# Patient Record
Sex: Male | Born: 1965 | Race: White | Hispanic: Yes | Marital: Married | Smoking: Former smoker
Health system: Southern US, Community
[De-identification: ages and names within clinical notes are randomized; demographics above are authoritative.]

## PROBLEM LIST (undated history)

## (undated) DIAGNOSIS — E119 Type 2 diabetes mellitus without complications: Secondary | ICD-10-CM

---

## 2006-11-04 ENCOUNTER — Emergency Department (HOSPITAL_COMMUNITY): Admission: EM | Admit: 2006-11-04 | Discharge: 2006-11-05 | Payer: Self-pay | Admitting: Emergency Medicine

## 2007-10-14 ENCOUNTER — Emergency Department (HOSPITAL_COMMUNITY): Admission: EM | Admit: 2007-10-14 | Discharge: 2007-10-14 | Payer: Self-pay | Admitting: Emergency Medicine

## 2009-05-07 ENCOUNTER — Emergency Department (HOSPITAL_COMMUNITY): Admission: EM | Admit: 2009-05-07 | Discharge: 2009-05-07 | Payer: Self-pay | Admitting: Emergency Medicine

## 2010-07-27 ENCOUNTER — Encounter
Admission: RE | Admit: 2010-07-27 | Discharge: 2010-07-27 | Payer: Self-pay | Source: Home / Self Care | Attending: Physical Medicine and Rehabilitation | Admitting: Physical Medicine and Rehabilitation

## 2010-11-20 LAB — COMPREHENSIVE METABOLIC PANEL
ALT: 26 U/L (ref 0–53)
AST: 22 U/L (ref 0–37)
CO2: 26 mEq/L (ref 19–32)
Chloride: 101 mEq/L (ref 96–112)
GFR calc Af Amer: >60 mL/min (ref >60)
GFR calc non Af Amer: >60 mL/min (ref >60)
Glucose, Bld: 149 mg/dL — ABNORMAL HIGH (ref 70–99)
Sodium: 136 mEq/L (ref 135–145)
Total Bilirubin: 0.7 mg/dL (ref 0.3–1.2)

## 2010-11-20 LAB — URINALYSIS, ROUTINE W REFLEX MICROSCOPIC
Glucose, UA: NEGATIVE mg/dL
Ketones, ur: NEGATIVE mg/dL
Leukocytes, UA: NEGATIVE
Protein, ur: NEGATIVE mg/dL
pH: 7.5 (ref 5.0–8.0)

## 2010-11-20 LAB — URINE MICROSCOPIC-ADD ON

## 2010-11-20 LAB — LIPASE, BLOOD: Lipase: 32 U/L (ref 11–59)

## 2010-11-20 LAB — CBC
Hemoglobin: 15.2 g/dL (ref 13.0–17.0)
MCV: 86.2 fL (ref 78.0–100.0)
RBC: 4.96 MIL/uL (ref 4.22–5.81)
WBC: 14.3 10*3/uL — ABNORMAL HIGH (ref 4.0–10.5)

## 2010-11-20 LAB — DIFFERENTIAL
Basophils Absolute: 0 10*3/uL (ref 0.0–0.1)
Basophils Relative: 0 % (ref 0–1)
Eosinophils Absolute: 0 10*3/uL (ref 0.0–0.7)
Eosinophils Relative: 0 % (ref 0–5)
Neutrophils Relative %: 83 % — ABNORMAL HIGH (ref 43–77)

## 2010-11-20 LAB — URINE CULTURE

## 2018-11-05 ENCOUNTER — Other Ambulatory Visit: Payer: Self-pay

## 2018-11-05 ENCOUNTER — Emergency Department (HOSPITAL_COMMUNITY): Payer: BLUE CROSS/BLUE SHIELD

## 2018-11-05 ENCOUNTER — Encounter (HOSPITAL_COMMUNITY): Payer: Self-pay

## 2018-11-05 ENCOUNTER — Emergency Department (HOSPITAL_COMMUNITY)
Admission: EM | Admit: 2018-11-05 | Discharge: 2018-11-05 | Disposition: A | Discharge status: Home / Self Care | Payer: BLUE CROSS/BLUE SHIELD | Attending: Emergency Medicine | Admitting: Emergency Medicine

## 2018-11-05 DIAGNOSIS — R1012 Left upper quadrant pain: Secondary | ICD-10-CM | POA: Diagnosis not present

## 2018-11-05 DIAGNOSIS — Y939 Activity, unspecified: Secondary | ICD-10-CM | POA: Insufficient documentation

## 2018-11-05 DIAGNOSIS — S299XXA Unspecified injury of thorax, initial encounter: Secondary | ICD-10-CM | POA: Diagnosis present

## 2018-11-05 DIAGNOSIS — W108XXA Fall (on) (from) other stairs and steps, initial encounter: Secondary | ICD-10-CM | POA: Insufficient documentation

## 2018-11-05 DIAGNOSIS — S2242XA Multiple fractures of ribs, left side, initial encounter for closed fracture: Secondary | ICD-10-CM

## 2018-11-05 DIAGNOSIS — Y999 Unspecified external cause status: Secondary | ICD-10-CM | POA: Diagnosis not present

## 2018-11-05 DIAGNOSIS — Y929 Unspecified place or not applicable: Secondary | ICD-10-CM | POA: Insufficient documentation

## 2018-11-05 DIAGNOSIS — W2209XA Striking against other stationary object, initial encounter: Secondary | ICD-10-CM | POA: Diagnosis not present

## 2018-11-05 HISTORY — DX: Type 2 diabetes mellitus without complications: E11.9

## 2018-11-05 LAB — CBC WITH DIFFERENTIAL/PLATELET
Abs Immature Granulocytes: 0.03 10*3/uL (ref 0.00–0.07)
Basophils Absolute: 0.1 10*3/uL (ref 0.0–0.1)
Basophils Relative: 1 %
Eosinophils Absolute: 0.2 10*3/uL (ref 0.0–0.5)
Eosinophils Relative: 3 %
HCT: 47.2 % (ref 39.0–52.0)
Hemoglobin: 16.5 g/dL (ref 13.0–17.0)
Immature Granulocytes: 0 %
Lymphocytes Relative: 43 %
Lymphs Abs: 3.3 10*3/uL (ref 0.7–4.0)
MCH: 30.2 pg (ref 26.0–34.0)
MCHC: 35 g/dL (ref 30.0–36.0)
MCV: 86.3 fL (ref 80.0–100.0)
Monocytes Absolute: 0.6 10*3/uL (ref 0.1–1.0)
Monocytes Relative: 7 %
Neutro Abs: 3.5 10*3/uL (ref 1.7–7.7)
Neutrophils Relative %: 46 %
Platelets: 232 10*3/uL (ref 150–400)
RBC: 5.47 MIL/uL (ref 4.22–5.81)
RDW: 12.8 % (ref 11.5–15.5)
WBC: 7.6 10*3/uL (ref 4.0–10.5)
nRBC: 0 % (ref 0.0–0.2)

## 2018-11-05 LAB — BASIC METABOLIC PANEL
Anion gap: 10 (ref 5–15)
BUN: 29 mg/dL — ABNORMAL HIGH (ref 6–20)
CO2: 22 mmol/L (ref 22–32)
Calcium: 9.5 mg/dL (ref 8.9–10.3)
Chloride: 106 mmol/L (ref 98–111)
Creatinine, Ser: 1.33 mg/dL — ABNORMAL HIGH (ref 0.61–1.24)
GFR calc Af Amer: >60 mL/min (ref >60)
GFR calc non Af Amer: >60 mL/min (ref >60)
Glucose, Bld: 190 mg/dL — ABNORMAL HIGH (ref 70–99)
Potassium: 4.2 mmol/L (ref 3.5–5.1)
Sodium: 138 mmol/L (ref 135–145)

## 2018-11-05 MED ORDER — IOHEXOL 300 MG/ML  SOLN
100.0000 mL | Freq: Once | INTRAMUSCULAR | Status: AC | PRN
  Administered 2018-11-05: 100 mL via INTRAVENOUS

## 2018-11-05 MED ORDER — KETOROLAC TROMETHAMINE 30 MG/ML IJ SOLN
15.0000 mg | Freq: Once | INTRAMUSCULAR | Status: AC
  Administered 2018-11-05: 15 mg via INTRAVENOUS
  Filled 2018-11-05: qty 1

## 2018-11-05 MED ORDER — DIAZEPAM 5 MG PO TABS: 2.5000 mg | ORAL_TABLET | Freq: Three times a day (TID) | ORAL | 0 refills | Status: DC | PRN

## 2018-11-05 MED ORDER — HYDROMORPHONE HCL 1 MG/ML IJ SOLN
1.0000 mg | Freq: Once | INTRAMUSCULAR | Status: AC
  Administered 2018-11-05: 1 mg via INTRAVENOUS
  Filled 2018-11-05: qty 1

## 2018-11-05 MED ORDER — IBUPROFEN 600 MG PO TABS: 600.0000 mg | ORAL_TABLET | Freq: Four times a day (QID) | ORAL | 0 refills | Status: DC | PRN

## 2018-11-05 MED ORDER — LORAZEPAM 2 MG/ML IJ SOLN
1.0000 mg | Freq: Once | INTRAMUSCULAR | Status: AC
  Administered 2018-11-05: 1 mg via INTRAVENOUS
  Filled 2018-11-05: qty 1

## 2018-11-05 MED ORDER — OXYCODONE-ACETAMINOPHEN 5-325 MG PO TABS: 1.0000 | ORAL_TABLET | Freq: Four times a day (QID) | ORAL | 0 refills | Status: DC | PRN

## 2018-11-05 MED ORDER — HYDROMORPHONE HCL 1 MG/ML IJ SOLN
1.0000 mg | Freq: Once | INTRAMUSCULAR | Status: AC
Start: 2018-11-05 — End: 2018-11-05
  Administered 2018-11-05: 1 mg via INTRAVENOUS
  Filled 2018-11-05: qty 1

## 2018-11-05 MED ORDER — OXYCODONE-ACETAMINOPHEN 5-325 MG PO TABS: 2.0000 | ORAL_TABLET | ORAL | 0 refills | Status: DC | PRN

## 2018-11-05 MED ORDER — SODIUM CHLORIDE 0.9 % IV BOLUS
1000.0000 mL | Freq: Once | INTRAVENOUS | Status: AC
  Administered 2018-11-05: 1000 mL via INTRAVENOUS

## 2018-11-05 NOTE — Discharge Instructions (Signed)
Contact information for a hand surgeon with regards to your finger is included.

## 2018-11-05 NOTE — ED Triage Notes (Signed)
Pt brought in by [facility] following a fall from stool and hit his left rib cage area on chair, pt denies hitting head or LOC.

## 2018-11-05 NOTE — ED Provider Notes (Signed)
[facility] EMERGENCY DEPARTMENT Provider Note   CSN: [id number] Arrival date & time: 11/05/18  1715    History   Chief Complaint Chief Complaint  Patient presents with   Fall    HPI Emrey Maniscalco is a 53 y.o. male.     HPI   53 year old male presenting after fall.  He was standing on a stool trying to reach something.  He lost his balance and struck the left side of his chest against the rail of a smaller chair.  He has had severe and constant pain since then.  Pain in his left lateral chest and to his abdomen.  It is worse with movement and breathing.  He did not strike his head.  Denies any significant headache or neck pain.  He is not anticoagulated.  Patient is primarily Spanish-speaking.  Interpreter service was utilized.    There are no active problems to display for this patient.  History reviewed. No pertinent surgical history.    Home Medications    Prior to Admission medications   Not on File    Family History No family history on file.  Social History Social History   Tobacco Use   Smoking status: Not on file  Substance Use Topics   Alcohol use: Not on file   Drug use: Not on file     Allergies   Patient has no allergy information on record.   Review of Systems Review of Systems  All systems reviewed and negative, other than as noted in HPI.  Physical Exam Updated Vital Signs Ht 5\' 6"  (1.676 m)    Wt 68 kg    BMI 24.21 kg/m   Physical Exam Vitals signs and nursing note reviewed.  Constitutional:      General: He is in acute distress.     Appearance: He is well-developed.  HENT:     Head: Normocephalic and atraumatic.  Eyes:     General:        Right eye: No discharge.        Left eye: No discharge.     Conjunctiva/sclera: Conjunctivae normal.  Neck:     Musculoskeletal: Neck supple.  Cardiovascular:     Rate and Rhythm: Normal rate and regular rhythm.     Heart sounds: Normal heart sounds. No murmur. No friction rub. No  gallop.   Pulmonary:     Effort: Pulmonary effort is normal. No respiratory distress.     Breath sounds: Normal breath sounds.  Abdominal:     General: There is no distension.     Palpations: Abdomen is soft.     Tenderness: There is no abdominal tenderness.  Musculoskeletal:     Comments: Tenderness to palpation along the left lateral chest wall.  No overlying skin changes.  No crepitus.  Breath sounds sound clear and symmetric bilaterally although he is splinting generally not moving great air.  He is tender in his left flank and into the mid abdomen.  Bedside FAST exam without evidence of free fluid.  No midline spinal tenderness.  Skin:    General: Skin is warm and dry.  Neurological:     Mental Status: He is alert.  Psychiatric:        Behavior: Behavior normal.        Thought Content: Thought content normal.      ED Treatments / Results  Labs (all labs ordered are listed, but only abnormal results are displayed) Labs Reviewed  BASIC METABOLIC PANEL - Abnormal; Notable  for the following components:      Result Value   Glucose, Bld 190 (*)    BUN 29 (*)    Creatinine, Ser 1.33 (*)    All other components within normal limits  CBC WITH DIFFERENTIAL/PLATELET    EKG None  Radiology Dg Ribs Unilateral W/chest Left  Result Date: 11/05/2018 CLINICAL DATA:  Recent fall with left-sided chest pain, initial encounter EXAM: LEFT RIBS AND CHEST - 3+ VIEW COMPARISON:  11/04/2006 FINDINGS: Cardiac shadow is within normal limits. The lungs are hypoinflated with elevation of the right hemidiaphragm. Mild right basilar atelectasis is seen. No pneumothorax or effusion is noted. Mildly displaced fractures of the left eighth through eleventh ribs are seen without evidence of pneumothorax. No other focal abnormality is seen. IMPRESSION: Multiple left rib fractures without pneumothorax. Electronically Signed   By: Alcide CleverMark  Lukens M.D.   On: 11/05/2018 19:12   Ct Abdomen Pelvis W  Contrast  Result Date: 11/05/2018 CLINICAL DATA:  Recent fall with left-sided chest pain, initial encounter EXAM: CT ABDOMEN AND PELVIS WITH CONTRAST TECHNIQUE: Multidetector CT imaging of the abdomen and pelvis was performed using the standard protocol following bolus administration of intravenous contrast. CONTRAST:  100mL OMNIPAQUE IOHEXOL 300 MG/ML  SOLN COMPARISON:  Plain films from earlier in the same day. FINDINGS: Lower chest: No acute abnormality. Hepatobiliary: No focal liver abnormality is seen. No gallstones, gallbladder wall thickening, or biliary dilatation. Pancreas: Unremarkable. No pancreatic ductal dilatation or surrounding inflammatory changes. Spleen: Normal in size without focal abnormality. Adrenals/Urinary Tract: Adrenal glands are within normal limits bilaterally. Kidneys demonstrate a normal enhancement pattern bilaterally. No visceral injury is seen. No extravasation is noted. The ureters are within normal limits. The bladder is well distended. Stomach/Bowel: No obstructive or inflammatory changes of the bowel are seen. The appendix is within normal limits. The stomach and small bowel are unremarkable. Vascular/Lymphatic: Aortic atherosclerosis. No enlarged abdominal or pelvic lymph nodes. Reproductive: Prostate is unremarkable. Other: No abdominal wall hernia or abnormality. No abdominopelvic ascites. Musculoskeletal: Rib fractures are noted involving the eighth through eleventh ribs on the left. Mild degenerative changes of the lumbar spine are seen. IMPRESSION: Multiple left rib fractures similar to that seen on prior plain film examination. No acute visceral abnormality is noted. Electronically Signed   By: Alcide CleverMark  Lukens M.D.   On: 11/05/2018 19:35    Procedures Procedures (including critical care time)  Definitve Fracture Care. Closed treatment of uncomplicated rib fracture: 4 (four):  Definitive fracture care was provided for four closed rib fractures. Treatment including  pain medication, discussion of possible complications including pneumonia, discussion of usage of incentive spirometry, and referral to primary care physician (our resource list provided) for further followup.  EMERGENCY DEPARTMENT US FAST EXAM "Limited Ultrasound of the Abdomen and Pericardium" (FAST Exam).   INDICATIONS:Blunt trauma to the thorax Multiple views of the abdomen and pericardium are obtained with a multi-frequency probe.  PERFORMED BY: Myself IMAGES ARCHIVED?: No LIMITATIONS:  Emergent procedure INTERPRETATION:  Abdominal free fluid present   Medications Ordered in ED Medications  sodium chloride 0.9 % bolus 1,000 mL (has no administration in time range)  LORazepam (ATIVAN) injection 1 mg (has no administration in time range)  HYDROmorphone (DILAUDID) injection 1 mg (has no administration in time range)     Initial Impression / Assessment and Plan / ED Course  I have reviewed the triage vital signs and the nursing notes.  Pertinent labs & imaging results that were available during my care of the  patient were reviewed by me and considered in my medical decision making (see chart for details).    52yM with multiple L rib fractures. No pneumothorax. No splenic laceration. Pain reasonably controlled. Continued pain meds. Incentive spirometry. Work note. Return precautions discussed.   Final Clinical Impressions(s) / ED Diagnoses   Final diagnoses:  Closed fracture of multiple ribs of left side, initial encounter    ED Discharge Orders    None       Raeford Razor, MD 11/06/18 [phone]

## 2018-11-06 MED FILL — Oxycodone w/ Acetaminophen Tab 5-325 MG: ORAL | Qty: 6 | Status: AC

## 2019-06-13 ENCOUNTER — Other Ambulatory Visit: Payer: Self-pay

## 2019-06-13 DIAGNOSIS — Z20822 Contact with and (suspected) exposure to covid-19: Secondary | ICD-10-CM

## 2019-06-14 LAB — NOVEL CORONAVIRUS, NAA: SARS-CoV-2, NAA: NOT DETECTED

## 2019-06-15 ENCOUNTER — Telehealth: Payer: Self-pay | Admitting: Internal Medicine

## 2019-06-15 NOTE — Telephone Encounter (Signed)
Patient's son is calling for the patient's test results. Paitent's son will translate to the patient. Son expressed understanding.

## 2019-06-26 ENCOUNTER — Other Ambulatory Visit: Payer: Self-pay

## 2019-06-26 DIAGNOSIS — Z20822 Contact with and (suspected) exposure to covid-19: Secondary | ICD-10-CM

## 2019-06-27 LAB — NOVEL CORONAVIRUS, NAA: SARS-CoV-2, NAA: NOT DETECTED

## 2019-12-23 IMAGING — DX LEFT RIBS AND CHEST - 3+ VIEW
4 series · 4 of 4 positions shown · non-contrast
Comparison: 11/04/2006

CLINICAL DATA: Recent fall with left-sided chest pain, initial
encounter

EXAM:
LEFT RIBS AND CHEST - 3+ VIEW

[rib pa obl (1 of 2)]
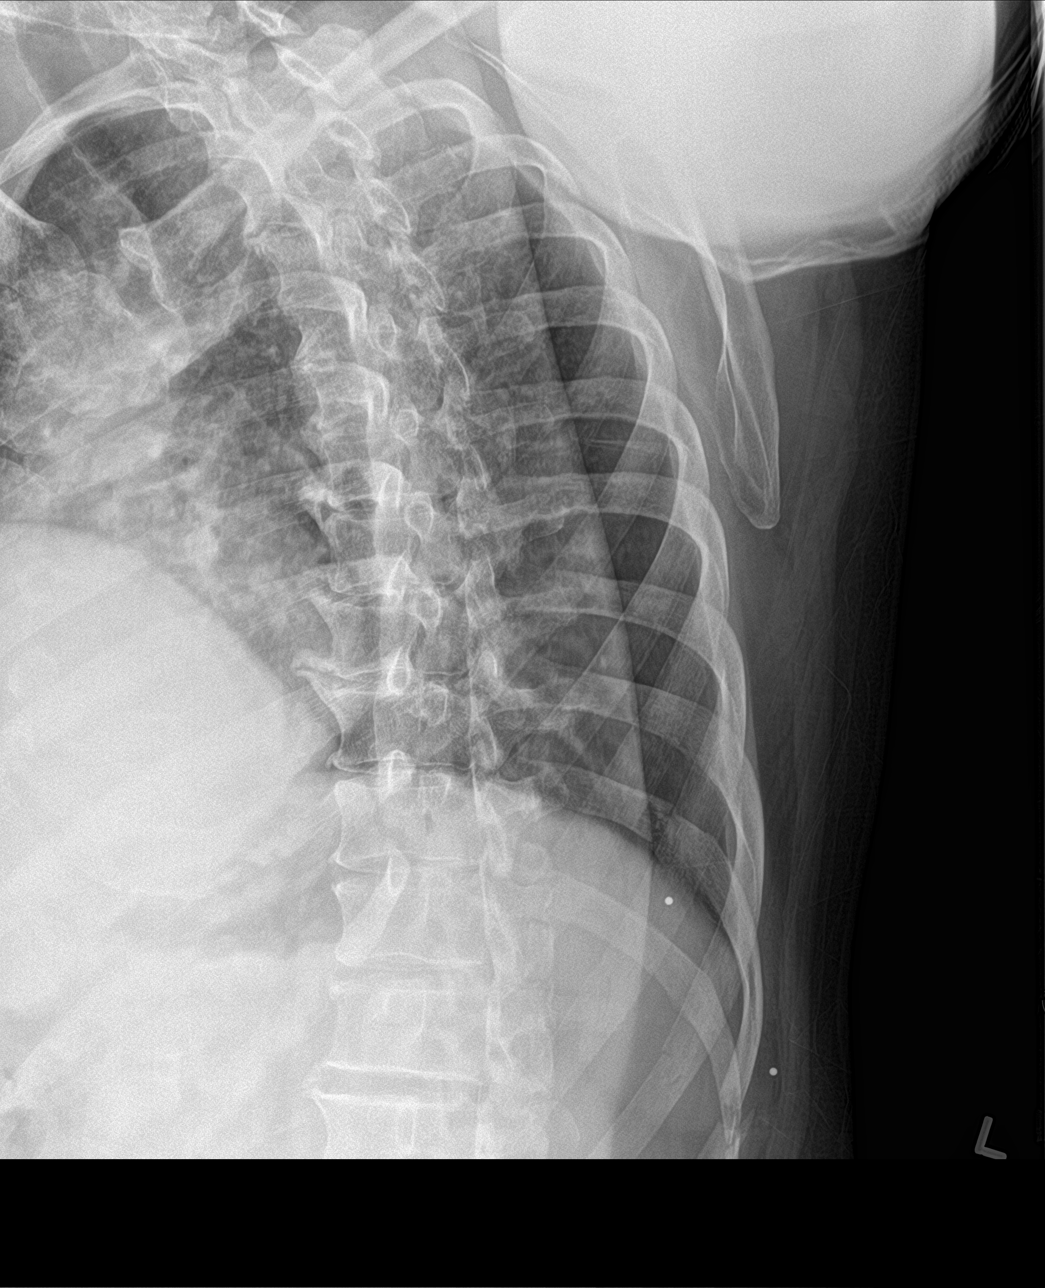

[rib pa obl (2 of 2)]
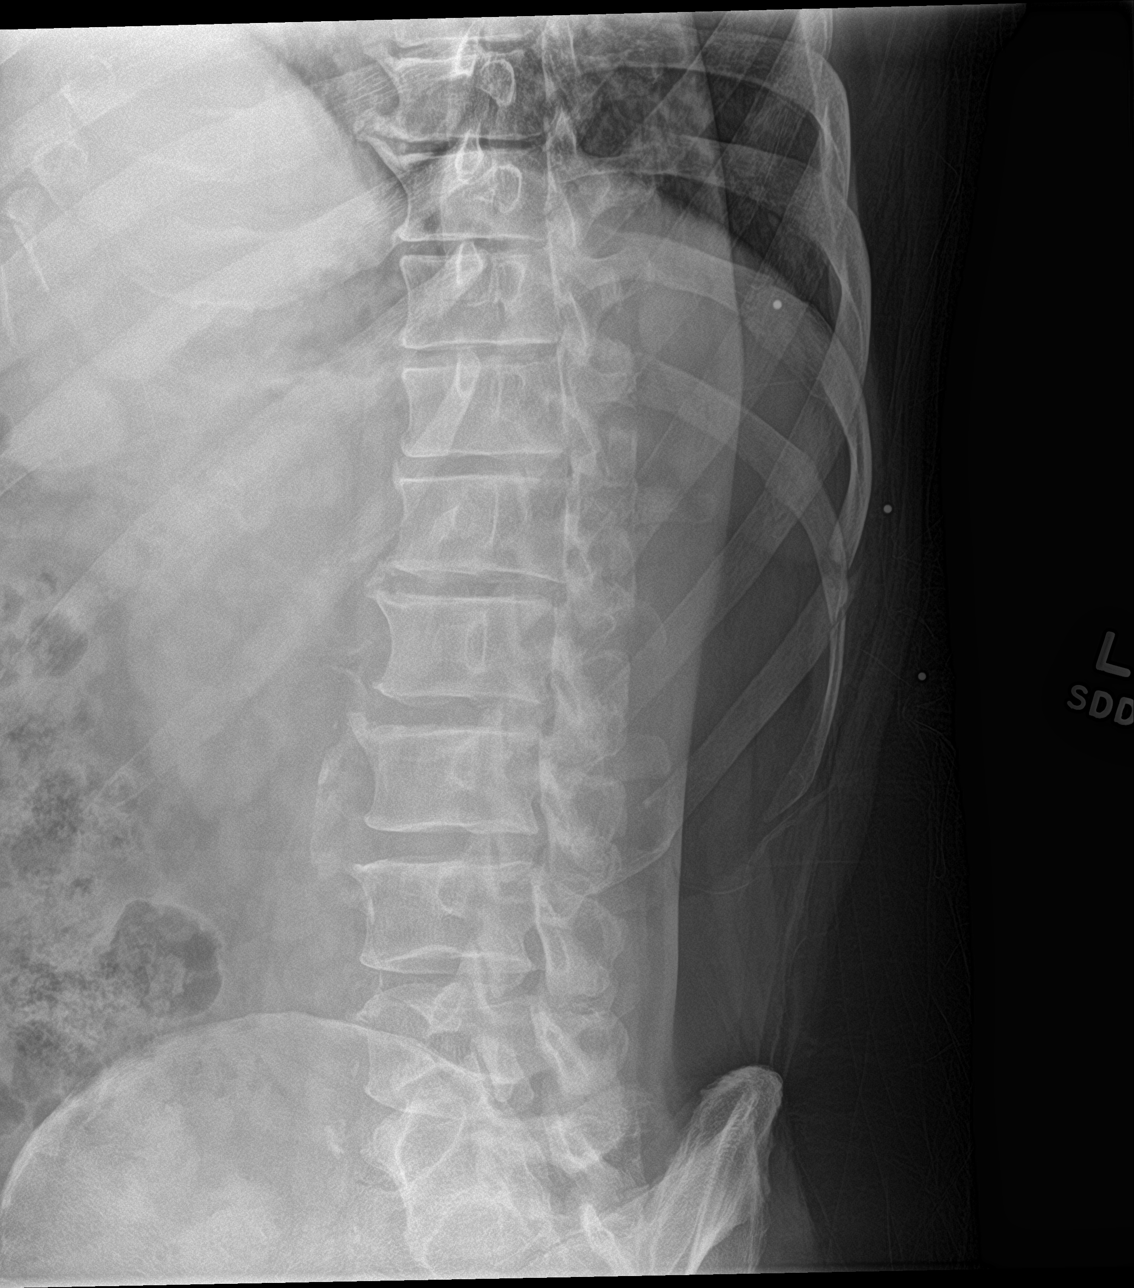

[rib pa]
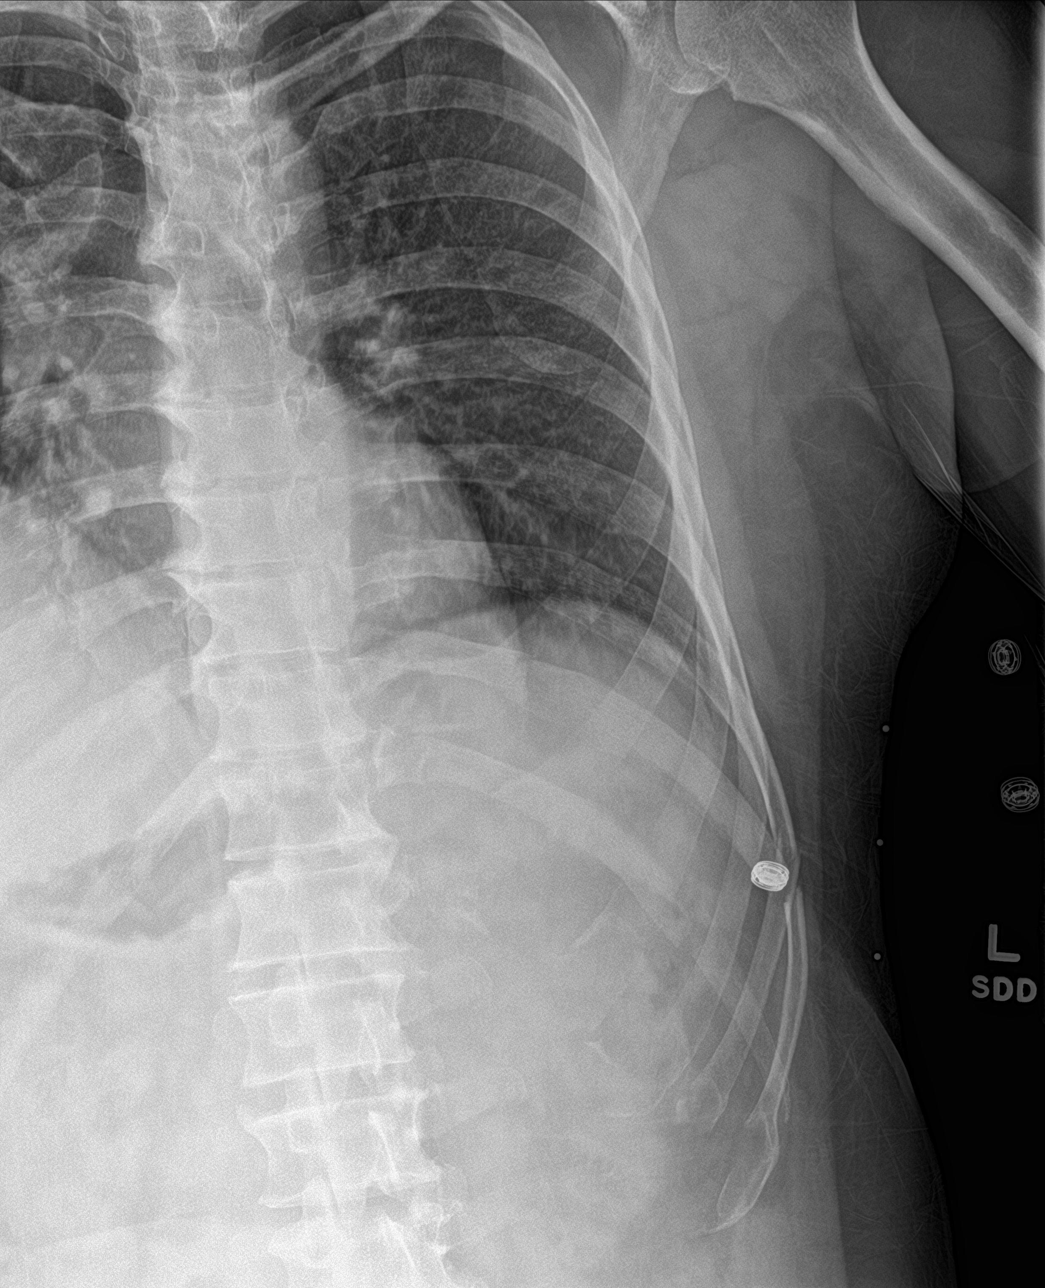

[chest ap]
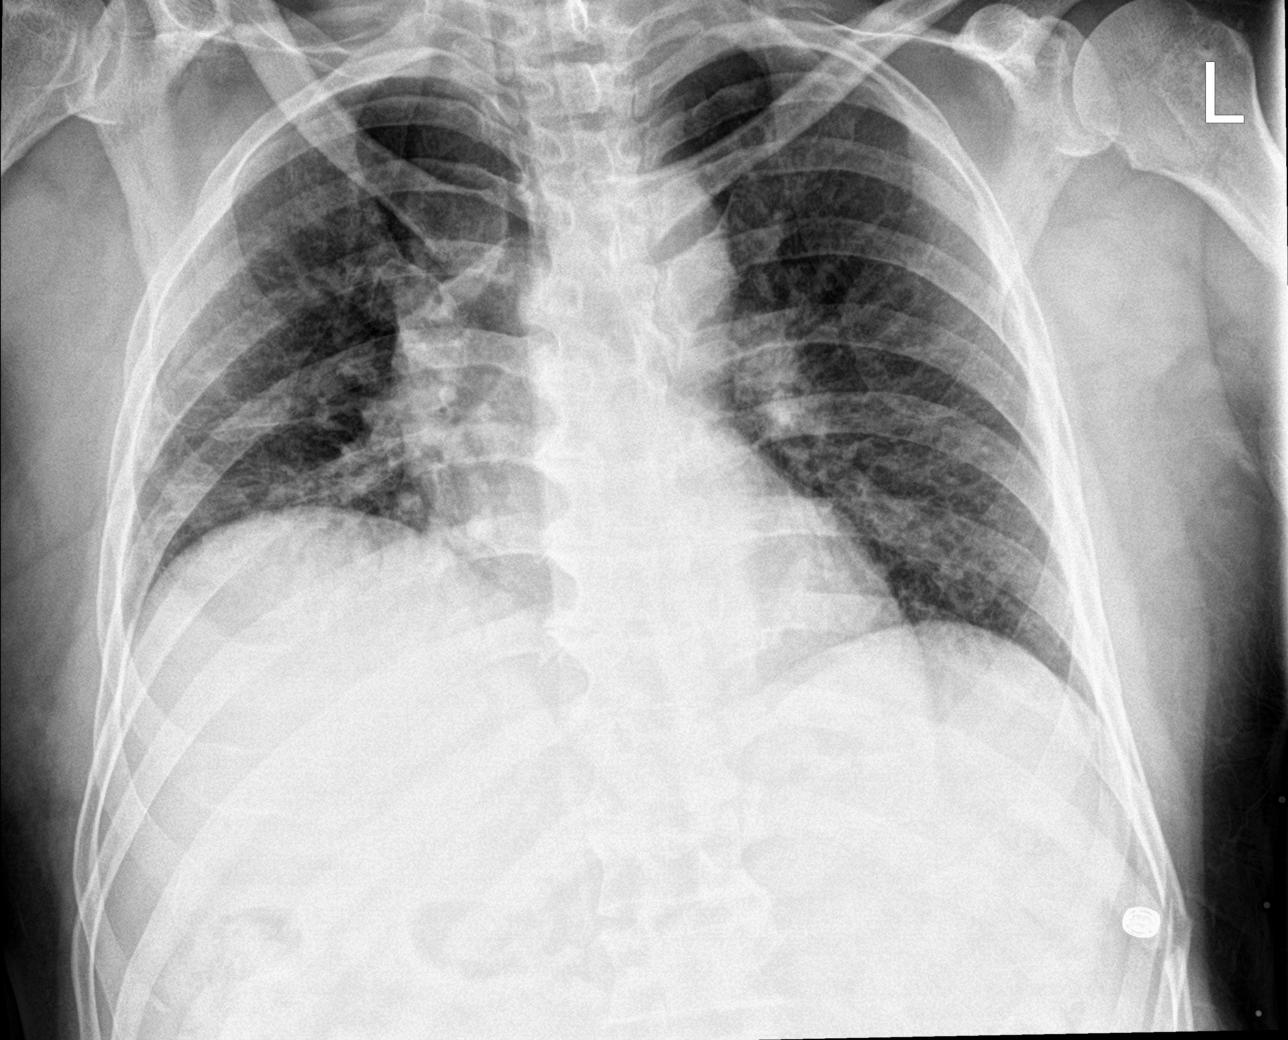

[4 of 4 positions shown; findings below may reference images not displayed]

FINDINGS: Cardiac shadow is within normal limits. The lungs are hypoinflated
with elevation of the right hemidiaphragm. Mild right basilar
atelectasis is seen. No pneumothorax or effusion is noted. Mildly
displaced fractures of the left eighth through eleventh ribs are
seen without evidence of pneumothorax. No other focal abnormality is
seen.
IMPRESSION: Multiple left rib fractures without pneumothorax.

## 2022-09-08 ENCOUNTER — Ambulatory Visit
Admission: EM | Admit: 2022-09-08 | Discharge: 2022-09-08 | Disposition: A | Discharge status: Home / Self Care | Payer: BC Managed Care – PPO | Attending: Nurse Practitioner | Admitting: Nurse Practitioner

## 2022-09-08 DIAGNOSIS — J069 Acute upper respiratory infection, unspecified: Secondary | ICD-10-CM

## 2022-09-08 DIAGNOSIS — Z1152 Encounter for screening for COVID-19: Secondary | ICD-10-CM

## 2022-09-08 MED ORDER — PROMETHAZINE-DM 6.25-15 MG/5ML PO SYRP: 5.0000 mL | ORAL_SOLUTION | Freq: Every evening | ORAL | 0 refills | Status: DC | PRN

## 2022-09-08 MED ORDER — BENZONATATE 100 MG PO CAPS: 100.0000 mg | ORAL_CAPSULE | Freq: Three times a day (TID) | ORAL | 0 refills | Status: DC | PRN

## 2022-09-08 NOTE — ED Triage Notes (Signed)
Pt reports cough, chest discomfort when coughing and body aches x 2 days. DayQuil, Nyquil and Advil gives some relief.

## 2022-09-08 NOTE — Discharge Instructions (Addendum)
You have a viral upper respiratory infection.  Symptoms should improve over the next week to 10 days.  If you develop chest pain or shortness of breath, go to the emergency room.  We have tested you today for COVID-19.  You will see the results in Mychart and we will call you with positive results.    Please stay home and isolate until you are aware of the results.    Some things that can make you feel better are: - Increased rest - Increasing fluid with water/sugar free electrolytes - Acetaminophen and ibuprofen as needed for fever/pain - Salt water gargling, chloraseptic spray and throat lozenges - OTC guaifenesin (Mucinex) 600 mg twice daily for congestion  - Saline sinus flushes or a neti pot for nasal congestion - Coricidin products  - Humidifying the air -Tessalon Perles during the day as needed for dry cough and cough syrup at nighttime as needed for dry cough

## 2022-09-08 NOTE — ED Provider Notes (Signed)
[facility]    CSN: [id number] Arrival date & time: 09/08/22  9485      History   Chief Complaint Chief Complaint  Patient presents with   Cough    HPI Miguel Chapman is a 57 y.o. male.   Patient presents today for 2-day history of bodyaches, cold chills, dry cough that is worse at nighttime, pain in his chest with coughing, chest and nasal congestion, runny nose, sore throat, headache, and fatigue.  He denies shortness of breath or chest pain at rest, wheezing in his chest, ear pain, abdominal pain, nausea/vomiting, diarrhea, and decreased appetite.  Reports his coworker was sick earlier this week, however not diagnosed with anything that he knows of.  He has not done any COVID-19 testing at home.  Has been taking DayQuil, NyQuil, Advil and Tylenol for his symptoms which seems to help a little bit.  Patient has a medical history significant for diabetes, denies any history of chronic lung disease.  Does not use inhalers.  Does not smoke or vape.  Spanish medical interpreter used for today's visit with the patient's permission.    Past Medical History:  Diagnosis Date   Diabetes mellitus without complication ([place])     There are no problems to display for this patient.   History reviewed. No pertinent surgical history.     Home Medications    Prior to Admission medications   Medication Sig Start Date End Date Taking? Authorizing Provider  benzonatate (TESSALON) 100 MG capsule Take 1 capsule (100 mg total) by mouth 3 (three) times daily as needed for cough. Do not take with alcohol or while driving or operating heavy machinery.  May cause drowsiness. 09/08/22  Yes Eulogio Bear, NP  Continuous Blood Gluc Sensor (FREESTYLE LIBRE 14 DAY SENSOR) MISC SMARTSIG:1 Topical Every 2 Weeks 06/02/22  Yes [provider]  FARXIGA 10 MG TABS tablet Take 10 mg by mouth daily. 08/26/22  Yes [provider]  metFORMIN (GLUCOPHAGE) 500 MG tablet Take by  mouth 2 (two) times daily with a meal.   Yes [provider]  promethazine-dextromethorphan (PROMETHAZINE-DM) 6.25-15 MG/5ML syrup Take 5 mLs by mouth at bedtime as needed for cough. Do not take with alcohol or while driving or operating heavy machinery.  May cause drowsiness. 09/08/22  Yes Eulogio Bear, NP  ibuprofen (ADVIL,MOTRIN) 600 MG tablet Take 1 tablet (600 mg total) by mouth every 6 (six) hours as needed. 11/05/18   Virgel Manifold, MD    Family History History reviewed. No pertinent family history.  Social History Social History   Tobacco Use   Smoking status: Every Day    Types: Cigarettes   Smokeless tobacco: Never  Substance Use Topics   Alcohol use: Yes    Comment: occ   Drug use: Never     Allergies   Patient has no known allergies.   Review of Systems Review of Systems Per HPI  Physical Exam Triage Vital Signs ED Triage Vitals [09/08/22 0920]  Enc Vitals Group     BP 127/72     Pulse Rate 70     Resp 18     Temp 98.2 F (36.8 C)     Temp Source Oral     SpO2 98 %     Weight      Height      Head Circumference      Peak Flow      Pain Score 0     Pain Loc  Pain Edu?      Excl. in [place]?    No data found.  Updated Vital Signs BP 127/72 (BP Location: Right Arm)   Pulse 70   Temp 98.2 F (36.8 C) (Oral)   Resp 18   SpO2 98%   Visual Acuity Right Eye Distance:   Left Eye Distance:   Bilateral Distance:    Right Eye Near:   Left Eye Near:    Bilateral Near:     Physical Exam Vitals and nursing note reviewed.  Constitutional:      General: He is not in acute distress.    Appearance: Normal appearance. He is not ill-appearing or toxic-appearing.  HENT:     Head: Normocephalic and atraumatic.     Right Ear: Tympanic membrane, ear canal and external ear normal.     Left Ear: Tympanic membrane, ear canal and external ear normal.     Nose: Congestion and rhinorrhea present.     Mouth/Throat:     Mouth: Mucous membranes  are moist.     Pharynx: Oropharynx is clear. Posterior oropharyngeal erythema present. No oropharyngeal exudate.  Eyes:     General: No scleral icterus.    Extraocular Movements: Extraocular movements intact.  Cardiovascular:     Rate and Rhythm: Normal rate and regular rhythm.  Pulmonary:     Effort: Pulmonary effort is normal. No respiratory distress.     Breath sounds: Normal breath sounds. No wheezing, rhonchi or rales.  Abdominal:     General: Abdomen is flat. Bowel sounds are normal. There is no distension.     Palpations: Abdomen is soft.     Tenderness: There is no abdominal tenderness. There is no guarding.  Musculoskeletal:     Cervical back: Normal range of motion and neck supple.  Lymphadenopathy:     Cervical: No cervical adenopathy.  Skin:    General: Skin is warm and dry.     Coloration: Skin is not jaundiced or pale.     Findings: No erythema or rash.  Neurological:     Mental Status: He is alert and oriented to person, place, and time.  Psychiatric:        Behavior: Behavior is cooperative.      UC Treatments / Results  Labs (all labs ordered are listed, but only abnormal results are displayed) Labs Reviewed  SARS CORONAVIRUS 2 (TAT 6-24 HRS)    EKG   Radiology No results found.  Procedures Procedures (including critical care time)  Medications Ordered in UC Medications - No data to display  Initial Impression / Assessment and Plan / UC Course  I have reviewed the triage vital signs and the nursing notes.  Pertinent labs & imaging results that were available during my care of the patient were reviewed by me and considered in my medical decision making (see chart for details).   Patient is well-appearing, normotensive, afebrile, not tachycardic, not tachypneic, oxygenating well on room air.    1. Encounter for screening for COVID-19 2. Viral URI with cough Suspect viral etiology COVID-19 testing obtained for rule out Patient is a candidate  for molnupiravir if he tests positive Supportive care discussed with patient-start cough suppressants as needed Note given for work ER and return precautions discussed with patient  The patient was given the opportunity to ask questions.  All questions answered to their satisfaction.  The patient is in agreement to this plan.    Final Clinical Impressions(s) / UC Diagnoses   Final diagnoses:  Encounter for screening for COVID-19  Viral URI with cough     Discharge Instructions      You have a viral upper respiratory infection.  Symptoms should improve over the next week to 10 days.  If you develop chest pain or shortness of breath, go to the emergency room.  We have tested you today for COVID-19.  You will see the results in Mychart and we will call you with positive results.    Please stay home and isolate until you are aware of the results.    Some things that can make you feel better are: - Increased rest - Increasing fluid with water/sugar free electrolytes - Acetaminophen and ibuprofen as needed for fever/pain - Salt water gargling, chloraseptic spray and throat lozenges - OTC guaifenesin (Mucinex) 600 mg twice daily for congestion  - Saline sinus flushes or a neti pot for nasal congestion - Coricidin products  - Humidifying the air -Tessalon Perles during the day as needed for dry cough and cough syrup at nighttime as needed for dry cough     ED Prescriptions     Medication Sig Dispense Auth. Provider   benzonatate (TESSALON) 100 MG capsule Take 1 capsule (100 mg total) by mouth 3 (three) times daily as needed for cough. Do not take with alcohol or while driving or operating heavy machinery.  May cause drowsiness. 21 capsule Cathlean Marseilles A, NP   promethazine-dextromethorphan (PROMETHAZINE-DM) 6.25-15 MG/5ML syrup Take 5 mLs by mouth at bedtime as needed for cough. Do not take with alcohol or while driving or operating heavy machinery.  May cause drowsiness. 118 mL  Valentino Nose, NP      I have reviewed the PDMP during this encounter.   Valentino Nose, NP 09/08/22 1200

## 2022-09-09 ENCOUNTER — Telehealth (HOSPITAL_COMMUNITY): Payer: Self-pay | Admitting: Emergency Medicine

## 2022-09-09 LAB — SARS CORONAVIRUS 2 (TAT 6-24 HRS): SARS Coronavirus 2: POSITIVE — AB

## 2022-09-09 MED ORDER — MOLNUPIRAVIR EUA 200MG CAPSULE: 4.0000 | ORAL_CAPSULE | Freq: Two times a day (BID) | ORAL | 0 refills | Status: AC

## 2022-09-10 ENCOUNTER — Telehealth (HOSPITAL_COMMUNITY): Payer: Self-pay | Admitting: Emergency Medicine

## 2022-09-10 NOTE — Telephone Encounter (Signed)
[facility] called because [employer] is out of [employer].  WE discussed the medicine in detail, I answered all of his questions, and he decided at this time not to have it sent anywhere else, but he would call back if he changes his mind

## 2022-10-17 ENCOUNTER — Other Ambulatory Visit: Payer: Self-pay

## 2022-10-17 ENCOUNTER — Encounter (HOSPITAL_COMMUNITY): Payer: Self-pay

## 2022-10-17 ENCOUNTER — Emergency Department (HOSPITAL_COMMUNITY): Payer: BC Managed Care – PPO

## 2022-10-17 ENCOUNTER — Emergency Department (HOSPITAL_COMMUNITY)
Admission: EM | Admit: 2022-10-17 | Discharge: 2022-10-18 | Disposition: A | Discharge status: Home / Self Care | Payer: BC Managed Care – PPO | Attending: Emergency Medicine | Admitting: Emergency Medicine

## 2022-10-17 DIAGNOSIS — Z7984 Long term (current) use of oral hypoglycemic drugs: Secondary | ICD-10-CM | POA: Diagnosis not present

## 2022-10-17 DIAGNOSIS — R072 Precordial pain: Secondary | ICD-10-CM | POA: Insufficient documentation

## 2022-10-17 DIAGNOSIS — E1165 Type 2 diabetes mellitus with hyperglycemia: Secondary | ICD-10-CM | POA: Diagnosis not present

## 2022-10-17 DIAGNOSIS — R42 Dizziness and giddiness: Secondary | ICD-10-CM | POA: Insufficient documentation

## 2022-10-17 LAB — CBC WITH DIFFERENTIAL/PLATELET
Abs Immature Granulocytes: 0.03 10*3/uL (ref 0.00–0.07)
Basophils Absolute: 0.1 10*3/uL (ref 0.0–0.1)
Basophils Relative: 1 %
Eosinophils Absolute: 0.2 10*3/uL (ref 0.0–0.5)
Eosinophils Relative: 2 %
HCT: 44.3 % (ref 39.0–52.0)
Hemoglobin: 15.8 g/dL (ref 13.0–17.0)
Immature Granulocytes: 0 %
Lymphocytes Relative: 19 %
Lymphs Abs: 1.8 10*3/uL (ref 0.7–4.0)
MCH: 31.2 pg (ref 26.0–34.0)
MCHC: 35.7 g/dL (ref 30.0–36.0)
MCV: 87.5 fL (ref 80.0–100.0)
Monocytes Absolute: 0.6 10*3/uL (ref 0.1–1.0)
Monocytes Relative: 6 %
Neutro Abs: 6.9 10*3/uL (ref 1.7–7.7)
Neutrophils Relative %: 72 %
Platelets: 205 10*3/uL (ref 150–400)
RBC: 5.06 MIL/uL (ref 4.22–5.81)
RDW: 12.7 % (ref 11.5–15.5)
WBC: 9.6 10*3/uL (ref 4.0–10.5)
nRBC: 0 % (ref 0.0–0.2)

## 2022-10-17 LAB — CBG MONITORING, ED
Glucose-Capillary: 126 mg/dL — ABNORMAL HIGH (ref 70–99)
Glucose-Capillary: 198 mg/dL — ABNORMAL HIGH (ref 70–99)

## 2022-10-17 NOTE — ED Triage Notes (Signed)
Pt reports his sugar was 46 at home and he ate some chocolate but still doesn't feel well.

## 2022-10-17 NOTE — ED Notes (Signed)
Pt states he started ozempic 2 days ago. Today pt felt nauseated and had chest pain and felt like it was hard to breathe. Pt denies chest pain at this time.

## 2022-10-18 LAB — COMPREHENSIVE METABOLIC PANEL
ALT: 25 U/L (ref 0–44)
AST: 32 U/L (ref 15–41)
Albumin: 4.1 g/dL (ref 3.5–5.0)
Alkaline Phosphatase: 83 U/L (ref 38–126)
Anion gap: 12 (ref 5–15)
BUN: 19 mg/dL (ref 6–20)
CO2: 21 mmol/L — ABNORMAL LOW (ref 22–32)
Calcium: 9.1 mg/dL (ref 8.9–10.3)
Chloride: 102 mmol/L (ref 98–111)
Creatinine, Ser: 0.93 mg/dL (ref 0.61–1.24)
GFR, Estimated: >60 mL/min (ref >60)
Glucose, Bld: 186 mg/dL — ABNORMAL HIGH (ref 70–99)
Potassium: 3.1 mmol/L — ABNORMAL LOW (ref 3.5–5.1)
Sodium: 135 mmol/L (ref 135–145)
Total Bilirubin: 0.8 mg/dL (ref 0.3–1.2)
Total Protein: 7.1 g/dL (ref 6.5–8.1)

## 2022-10-18 LAB — TROPONIN I (HIGH SENSITIVITY): Troponin I (High Sensitivity): 2 ng/L (ref <18)

## 2022-10-18 LAB — CBG MONITORING, ED: Glucose-Capillary: 219 mg/dL — ABNORMAL HIGH (ref 70–99)

## 2022-10-18 NOTE — Discharge Instructions (Addendum)
Please hold your humalog until you talk to your doctor

## 2022-10-18 NOTE — ED Provider Notes (Signed)
[facility] Provider Note   CSN: [id number] Arrival date & time: 10/17/22  2242     History  Chief Complaint  Patient presents with   Hypoglycemia    Miguel Chapman is a 57 y.o. male.  The history is provided by the patient. The history is limited by a language barrier. A language interpreter was used (spanish - mauricio).  Pt reports his blood glucose was low during the day He also reports chest pain earlier (none at this time) He also reports dizziness earlier in the day He also reports generalized weakness while having hypoglycemia No fever/vomiting/diarrhea He has shortness of breath during the CP episodes He reports CP was worse during episode of hypoglycemia He ate chocolate to help his blood glucose   No h/o CAD/CVA He is a nonsmoker  He is on metformin and farxiga He just started ozempic and humalog Home Medications Prior to Admission medications   Medication Sig Start Date End Date Taking? Authorizing Provider  Continuous Blood Gluc Sensor (FREESTYLE LIBRE 14 DAY SENSOR) MISC SMARTSIG:1 Topical Every 2 Weeks 06/02/22   [provider]  FARXIGA 10 MG TABS tablet Take 10 mg by mouth daily. 08/26/22   [provider]  metFORMIN (GLUCOPHAGE) 500 MG tablet Take by mouth 2 (two) times daily with a meal.    [provider]      Allergies    Patient has no known allergies.    Review of Systems   Review of Systems  Constitutional:  Negative for fever.  Respiratory:  Positive for shortness of breath.   Cardiovascular:  Positive for chest pain.  Gastrointestinal:  Negative for vomiting.    Physical Exam Updated Vital Signs BP 128/77   Pulse 81   Temp (!) 97.1 F (36.2 C) (Temporal)   Resp 16   Ht 1.676 m ('5\' 6"'$ )   Wt 74.8 kg   SpO2 96%   BMI 26.63 kg/m  Physical Exam CONSTITUTIONAL: Well developed/well nourished HEAD: Normocephalic/atraumatic EYES: EOMI/PERRL ENMT: Mucous membranes  moist NECK: supple no meningeal signs SPINE/BACK:entire spine nontender CV: S1/S2 noted, no murmurs/rubs/gallops noted LUNGS: Lungs are clear to auscultation bilaterally, no apparent distress ABDOMEN: soft, nontender, no rebound or guarding, bowel sounds noted throughout abdomen GU:no cva tenderness NEURO: Pt is awake/alert/appropriate, moves all extremitiesx4.  No facial droop.  No arm or leg drift EXTREMITIES: pulses normal/equal, full ROM SKIN: warm, color normal PSYCH: no abnormalities of mood noted, alert and oriented to situation  ED Results / Procedures / Treatments   Labs (all labs ordered are listed, but only abnormal results are displayed) Labs Reviewed  COMPREHENSIVE METABOLIC PANEL - Abnormal; Notable for the following components:      Result Value   Potassium 3.1 (*)    CO2 21 (*)    Glucose, Bld 186 (*)    All other components within normal limits  CBG MONITORING, ED - Abnormal; Notable for the following components:   Glucose-Capillary 126 (*)    All other components within normal limits  CBG MONITORING, ED - Abnormal; Notable for the following components:   Glucose-Capillary 198 (*)    All other components within normal limits  CBG MONITORING, ED - Abnormal; Notable for the following components:   Glucose-Capillary 219 (*)    All other components within normal limits  CBC WITH DIFFERENTIAL/PLATELET  CBG MONITORING, ED  TROPONIN I (HIGH SENSITIVITY)  TROPONIN I (HIGH SENSITIVITY)    EKG EKG Interpretation  Date/Time:  Sunday  October 17 2022 23:21:23 EST Ventricular Rate:  84 PR Interval:  175 QRS Duration: 107 QT Interval:  377 QTC Calculation: 446 R Axis:   85 Text Interpretation: Sinus rhythm Nonspecific T abnormalities, inferior leads No previous ECGs available Confirmed by Ripley Fraise [phone]) on 10/18/2022 12:33:34 AM  Radiology DG Chest Port 1 View  Result Date: 10/17/2022 CLINICAL DATA:  History of COVID with difficulty breathing EXAM: PORTABLE  CHEST 1 VIEW COMPARISON:  11/05/2018 FINDINGS: Cardiac shadow is within normal limits. The lungs are clear. No focal infiltrate or effusion is seen. No bony abnormality is noted. IMPRESSION: No active disease. Electronically Signed   By: Inez Catalina M.D.   On: 10/17/2022 23:59    Procedures Procedures    Medications Ordered in ED Medications - No data to display  ED Course/ Medical Decision Making/ A&P Clinical Course as of 10/18/22 0053  Mon Oct 18, 2022  0052 Glucose(!): 186 Hyperglycemia [DW]  0052 Patient back to baseline.  All the symptoms resolved after the hypoglycemia resolved.  He is safe for discharge home.  Advised him to hold his Humalog as he is on multiple medications at this time and is likely overmedicated.  He will call his PCP today [DW]    Clinical Course User Index [DW] Ripley Fraise, MD                             Medical Decision Making Amount and/or Complexity of Data Reviewed Labs: ordered. Decision-making details documented in ED Course. Radiology: ordered.   This patient presents to the ED for concern of chest pain, this involves an extensive number of treatment options, and is a complaint that carries with it a high risk of complications and morbidity.  The differential diagnosis includes but is not limited to acute coronary syndrome, aortic dissection, pulmonary embolism, pericarditis, pneumothorax, pneumonia, myocarditis, pleurisy, esophageal rupture    Comorbidities that complicate the patient evaluation: Patient's presentation is complicated by their history of diabetes  Social Determinants of Health: Patient's  english as a second language   increases the complexity of managing their presentation  Additional history obtained: Records reviewed  urgent care  Lab Tests: I Ordered, and personally interpreted labs.  The pertinent results include: Mild hyperglycemia  Imaging Studies ordered: I ordered imaging studies including X-ray chest   I  independently visualized and interpreted imaging which showed no acute findings I agree with the radiologist interpretation  Cardiac Monitoring: The patient was maintained on a cardiac monitor.  I personally viewed and interpreted the cardiac monitor which showed an underlying rhythm of:  sinus rhythm   Test Considered: Low suspicion for ACS/PE/dissection.  Will defer further workup   Reevaluation: After the interventions noted above, I reevaluated the patient and found that they have :improved  Complexity of problems addressed: Patient's presentation is most consistent with  acute presentation with potential threat to life or bodily function  Disposition: After consideration of the diagnostic results and the patient's response to treatment,  I feel that the patent would benefit from discharge   .           Final Clinical Impression(s) / ED Diagnoses Final diagnoses:  Precordial pain    Rx / DC Orders ED Discharge Orders     None         Ripley Fraise, MD 10/18/22 [phone]

## 2023-02-12 ENCOUNTER — Ambulatory Visit
Admission: EM | Admit: 2023-02-12 | Discharge: 2023-02-12 | Disposition: A | Discharge status: Home / Self Care | Payer: BC Managed Care – PPO

## 2023-02-12 ENCOUNTER — Other Ambulatory Visit: Payer: Self-pay

## 2023-02-12 ENCOUNTER — Encounter: Payer: Self-pay | Admitting: Emergency Medicine

## 2023-02-12 DIAGNOSIS — R112 Nausea with vomiting, unspecified: Secondary | ICD-10-CM | POA: Diagnosis not present

## 2023-02-12 DIAGNOSIS — R197 Diarrhea, unspecified: Secondary | ICD-10-CM | POA: Diagnosis not present

## 2023-02-12 MED ORDER — ONDANSETRON 4 MG PO TBDP: 4.0000 mg | ORAL_TABLET | Freq: Three times a day (TID) | ORAL | 0 refills | Status: AC | PRN

## 2023-02-12 MED ORDER — LOPERAMIDE HCL 2 MG PO CAPS: 2.0000 mg | ORAL_CAPSULE | Freq: Four times a day (QID) | ORAL | 0 refills | Status: AC | PRN

## 2023-02-12 NOTE — ED Triage Notes (Addendum)
Translator used for triage.   Pt reports abd pain, intermittent headache, emesis, diarrhea x2 days.

## 2023-02-12 NOTE — ED Provider Notes (Signed)
[facility]    CSN: [id number] Arrival date & time: 02/12/23  0935      History   Chief Complaint Chief Complaint  Patient presents with   Abdominal Pain    HPI Miguel Chapman is a 57 y.o. male.   Medical interpreter utilized today to facilitate visit with patient's consent.  Patient presenting today with 2-day history of mid abdominal pain, nausea, vomiting, diarrhea, intermittent headaches and bodyaches.  Denies melena, hematemesis, fever, chills, new foods or medications, sick contacts, recent travel.  Tolerating fluids well but not having any appetite to eat solids.  Not tried anything over-the-counter for symptoms thus far.    Past Medical History:  Diagnosis Date   Diabetes mellitus without complication (HCC)     There are no problems to display for this patient.   History reviewed. No pertinent surgical history.     Home Medications    Prior to Admission medications   Medication Sig Start Date End Date Taking? Authorizing Provider  atorvastatin (LIPITOR) 10 MG tablet Take 10 mg by mouth daily. Pt unsure of dosage.   Yes [provider]  loperamide (IMODIUM) 2 MG capsule Take 1 capsule (2 mg total) by mouth 4 (four) times daily as needed for diarrhea or loose stools. 02/12/23  Yes Particia Nearing, PA-C  ondansetron (ZOFRAN-ODT) 4 MG disintegrating tablet Take 1 tablet (4 mg total) by mouth every 8 (eight) hours as needed for nausea or vomiting. 02/12/23  Yes Particia Nearing, PA-C  Semaglutide (OZEMPIC, 1 MG/DOSE, [place]) Inject into the skin.   Yes [provider]  Continuous Blood Gluc Sensor (FREESTYLE LIBRE 14 DAY SENSOR) MISC SMARTSIG:1 Topical Every 2 Weeks 06/02/22   [provider]  FARXIGA 10 MG TABS tablet Take 10 mg by mouth daily. 08/26/22   [provider]  metFORMIN (GLUCOPHAGE) 500 MG tablet Take by mouth 2 (two) times daily with a meal.    [provider]    Family History History  reviewed. No pertinent family history.  Social History Social History   Tobacco Use   Smoking status: Former    Types: Cigarettes   Smokeless tobacco: Never  Substance Use Topics   Alcohol use: Yes    Comment: occ   Drug use: Never     Allergies   Patient has no known allergies.   Review of Systems Review of Systems PER HPI  Physical Exam Triage Vital Signs ED Triage Vitals  Enc Vitals Group     BP 02/12/23 0952 115/81     Pulse Rate 02/12/23 0952 82     Resp 02/12/23 0952 20     Temp 02/12/23 0952 98.1 F (36.7 C)     Temp Source 02/12/23 0952 Oral     SpO2 02/12/23 0952 97 %     Weight --      Height --      Head Circumference --      Peak Flow --      Pain Score 02/12/23 0949 2     Pain Loc --      Pain Edu? --      Excl. in GC? --    No data found.  Updated Vital Signs BP 115/81 (BP Location: Right Arm)   Pulse 82   Temp 98.1 F (36.7 C) (Oral)   Resp 20   SpO2 97%   Visual Acuity Right Eye Distance:   Left Eye Distance:   Bilateral Distance:    Right Eye  Near:   Left Eye Near:    Bilateral Near:     Physical Exam Vitals and nursing note reviewed.  Constitutional:      Appearance: Normal appearance.  HENT:     Head: Atraumatic.     Mouth/Throat:     Mouth: Mucous membranes are moist.     Pharynx: Oropharynx is clear.  Eyes:     Extraocular Movements: Extraocular movements intact.     Conjunctiva/sclera: Conjunctivae normal.  Cardiovascular:     Rate and Rhythm: Normal rate and regular rhythm.  Pulmonary:     Effort: Pulmonary effort is normal.     Breath sounds: Normal breath sounds.  Abdominal:     General: Bowel sounds are normal. There is no distension.     Palpations: Abdomen is soft. There is no mass.     Tenderness: There is abdominal tenderness. There is no right CVA tenderness, left CVA tenderness or guarding.     Hernia: No hernia is present.     Comments: Minimal periumbilical tenderness to palpation without distention  or guarding.  Negative McBurney's, negative Murphy's, no rebound tenderness  Musculoskeletal:        General: Normal range of motion.     Cervical back: Normal range of motion and neck supple.  Skin:    General: Skin is warm and dry.  Neurological:     General: No focal deficit present.     Mental Status: He is oriented to person, place, and time.  Psychiatric:        Mood and Affect: Mood normal.        Thought Content: Thought content normal.        Judgment: Judgment normal.      UC Treatments / Results  Labs (all labs ordered are listed, but only abnormal results are displayed) Labs Reviewed - No data to display  EKG   Radiology No results found.  Procedures Procedures (including critical care time)  Medications Ordered in UC Medications - No data to display  Initial Impression / Assessment and Plan / UC Course  I have reviewed the triage vital signs and the nursing notes.  Pertinent labs & imaging results that were available during my care of the patient were reviewed by me and considered in my medical decision making (see chart for details).     Vitals and exam reassuring and per patient symptoms have been improving today from the past 2 days.  Tolerating fluids well at home.  Will treat with Zofran, Imodium, brat diet for suspected viral GI illness.  Discussed strict return precautions for any worsening symptoms or inability to tolerate fluids.  Final Clinical Impressions(s) / UC Diagnoses   Final diagnoses:  Nausea vomiting and diarrhea   Discharge Instructions   None    ED Prescriptions     Medication Sig Dispense Auth. Provider   ondansetron (ZOFRAN-ODT) 4 MG disintegrating tablet Take 1 tablet (4 mg total) by mouth every 8 (eight) hours as needed for nausea or vomiting. 20 tablet Particia Nearing, [place]   loperamide (IMODIUM) 2 MG capsule Take 1 capsule (2 mg total) by mouth 4 (four) times daily as needed for diarrhea or loose stools. 12 capsule  Particia Nearing, [place]      PDMP not reviewed this encounter.   Particia Nearing, [place] 02/12/23 1100

## 2023-12-26 ENCOUNTER — Ambulatory Visit: Admission: EM | Admit: 2023-12-26 | Discharge: 2023-12-26 | Disposition: A | Discharge status: Home / Self Care

## 2023-12-26 DIAGNOSIS — S39012A Strain of muscle, fascia and tendon of lower back, initial encounter: Secondary | ICD-10-CM | POA: Diagnosis not present

## 2023-12-26 DIAGNOSIS — R42 Dizziness and giddiness: Secondary | ICD-10-CM

## 2023-12-26 MED ORDER — MECLIZINE HCL 25 MG PO TABS: 25.0000 mg | ORAL_TABLET | Freq: Three times a day (TID) | ORAL | 0 refills | Status: AC | PRN

## 2023-12-26 MED ORDER — TIZANIDINE HCL 2 MG PO CAPS: 2.0000 mg | ORAL_CAPSULE | Freq: Three times a day (TID) | ORAL | 0 refills | Status: AC | PRN

## 2023-12-26 MED ORDER — NAPROXEN 500 MG PO TABS: 500.0000 mg | ORAL_TABLET | Freq: Two times a day (BID) | ORAL | 0 refills | Status: AC | PRN

## 2023-12-26 NOTE — Discharge Instructions (Signed)
 I have prescribed a muscle relaxer and anti-inflammatory pain medication for a muscle strain in the low back that I believed to be causing the pain.  I have also provided a medication called meclizine for what I suspect to be vertigo type dizziness.  I have attached some information on both of these things for reference as well as some maneuvers that you can do to help with the dizziness.  Follow-up for worsening symptoms.

## 2023-12-26 NOTE — ED Triage Notes (Addendum)
 Pt reports dizziness and left side back pain, left side hip pain, the pain started in his back and went down into the hip and then dizziness onset about 15 mins later. Sx's started this morning around 7:30 am

## 2023-12-26 NOTE — ED Provider Notes (Signed)
 [facility]    CSN: [id number] Arrival date & time: 12/26/23  0856      History   Chief Complaint No chief complaint on file.   HPI Miguel Chapman is a 58 y.o. male.   Medical interpreter declined today, patient wishes for family member with him today to translate for him.  Patient complaining of left-sided low back pain radiating down the back of his left leg to about mid upper leg that started earlier this morning while at work.  He denies any known injury, weakness, numbness, tingling, bowel or bladder incontinence, saddle anesthesia, decreased range of motion.  He is also having some room spinning dizziness that started this morning additionally.  Denies visual change, headache, nausea, vomiting, mental status changes, speech or swallowing changes, extremity weakness numbness or tingling.  So far not trying anything over-the-counter for either symptom.    Past Medical History:  Diagnosis Date   Diabetes mellitus without complication (HCC)     There are no active problems to display for this patient.   History reviewed. No pertinent surgical history.     Home Medications    Prior to Admission medications   Medication Sig Start Date End Date Taking? Authorizing Provider  glimepiride (AMARYL) 4 MG tablet Take 4 mg by mouth 2 (two) times daily. 11/05/23  Yes [provider]  meclizine (ANTIVERT) 25 MG tablet Take 1 tablet (25 mg total) by mouth 3 (three) times daily as needed for dizziness. May cause drowsiness 12/26/23  Yes Corbin Dess, PA-C  naproxen (NAPROSYN) 500 MG tablet Take 1 tablet (500 mg total) by mouth 2 (two) times daily as needed. 12/26/23  Yes Corbin Dess, PA-C  tizanidine (ZANAFLEX) 2 MG capsule Take 1 capsule (2 mg total) by mouth 3 (three) times daily as needed for muscle spasms. Do not drink alcohol or drive while taking this medication.  May cause drowsiness. 12/26/23  Yes Corbin Dess, PA-C  atorvastatin  (LIPITOR) 10 MG tablet Take 10 mg by mouth daily. Pt unsure of dosage.    [provider]  Continuous Blood Gluc Sensor (FREESTYLE LIBRE 14 DAY SENSOR) MISC SMARTSIG:1 Topical Every 2 Weeks 06/02/22   [provider]  FARXIGA 10 MG TABS tablet Take 10 mg by mouth daily. 08/26/22   [provider]  loperamide  (IMODIUM ) 2 MG capsule Take 1 capsule (2 mg total) by mouth 4 (four) times daily as needed for diarrhea or loose stools. 02/12/23   Corbin Dess, PA-C  metFORMIN (GLUCOPHAGE) 500 MG tablet Take by mouth 2 (two) times daily with a meal.    [provider]  ondansetron  (ZOFRAN -ODT) 4 MG disintegrating tablet Take 1 tablet (4 mg total) by mouth every 8 (eight) hours as needed for nausea or vomiting. 02/12/23   Corbin Dess, PA-C  Semaglutide (OZEMPIC, 1 MG/DOSE, [place]) Inject into the skin.    [provider]    Family History History reviewed. No pertinent family history.  Social History Social History   Tobacco Use   Smoking status: Former    Types: Cigarettes   Smokeless tobacco: Never  Substance Use Topics   Alcohol use: Yes    Comment: occ   Drug use: Never     Allergies   Patient has no known allergies.   Review of Systems Review of Systems Per HPI  Physical Exam Triage Vital Signs ED Triage Vitals  Encounter Vitals Group     BP 12/26/23 0934 (!) 161/72  Systolic BP Percentile --      Diastolic BP Percentile --      Pulse Rate 12/26/23 0934 65     Resp 12/26/23 0934 18     Temp 12/26/23 0934 98.8 F (37.1 C)     Temp Source 12/26/23 0934 Oral     SpO2 12/26/23 0934 96 %     Weight --      Height --      Head Circumference --      Peak Flow --      Pain Score 12/26/23 0938 8     Pain Loc --      Pain Education --      Exclude from Growth Chart --    No data found.  Updated Vital Signs BP (!) 161/72 (BP Location: Right Arm)   Pulse 65   Temp 98.8 F (37.1 C) (Oral)   Resp 18   SpO2 96%    Visual Acuity Right Eye Distance:   Left Eye Distance:   Bilateral Distance:    Right Eye Near:   Left Eye Near:    Bilateral Near:     Physical Exam Vitals and nursing note reviewed.  Constitutional:      Appearance: Normal appearance.  HENT:     Head: Atraumatic.     Mouth/Throat:     Mouth: Mucous membranes are moist.  Eyes:     Extraocular Movements: Extraocular movements intact.     Conjunctiva/sclera: Conjunctivae normal.     Pupils: Pupils are equal, round, and reactive to light.  Cardiovascular:     Rate and Rhythm: Normal rate and regular rhythm.  Pulmonary:     Effort: Pulmonary effort is normal.     Breath sounds: Normal breath sounds.  Musculoskeletal:        General: Tenderness present. No swelling. Normal range of motion.     Cervical back: Normal range of motion and neck supple.     Comments: Left lateral lumbar region tenderness to palpation within the musculature.  No midline spinal tenderness to palpation diffusely.  Negative straight leg raise bilateral lower extremities.  Normal gait and range of motion.  Skin:    General: Skin is warm and dry.  Neurological:     General: No focal deficit present.     Mental Status: He is oriented to person, place, and time.     Cranial Nerves: No cranial nerve deficit.     Motor: No weakness.     Gait: Gait normal.  Psychiatric:        Mood and Affect: Mood normal.        Thought Content: Thought content normal.        Judgment: Judgment normal.      UC Treatments / Results  Labs (all labs ordered are listed, but only abnormal results are displayed) Labs Reviewed - No data to display  EKG   Radiology No results found.  Procedures Procedures (including critical care time)  Medications Ordered in UC Medications - No data to display  Initial Impression / Assessment and Plan / UC Course  I have reviewed the triage vital signs and the nursing notes.  Pertinent labs & imaging results that were  available during my care of the patient were reviewed by me and considered in my medical decision making (see chart for details).     Minimally hypertensive in triage, otherwise vital signs within normal limits.  He is overall well-appearing, in no acute distress with no  focal neurologic deficits on exam.  Do suspect his back/hip pain is muscular in nature causing some mild sciatica.  Will treat this with Zanaflex, naproxen, heat, massage, stretches.  Dizziness is most likely vertigo related.  Trial meclizine, Epley maneuvers, rest and follow-up with primary care for recheck of both issues.  Return for worsening symptoms.  Work note given.  Final Clinical Impressions(s) / UC Diagnoses   Final diagnoses:  Strain of lumbar region, initial encounter  Vertigo     Discharge Instructions      I have prescribed a muscle relaxer and anti-inflammatory pain medication for a muscle strain in the low back that I believed to be causing the pain.  I have also provided a medication called meclizine for what I suspect to be vertigo type dizziness.  I have attached some information on both of these things for reference as well as some maneuvers that you can do to help with the dizziness.  Follow-up for worsening symptoms.  ED Prescriptions     Medication Sig Dispense Auth. Provider   meclizine (ANTIVERT) 25 MG tablet Take 1 tablet (25 mg total) by mouth 3 (three) times daily as needed for dizziness. May cause drowsiness 15 tablet Corbin Dess, PA-C   tizanidine (ZANAFLEX) 2 MG capsule Take 1 capsule (2 mg total) by mouth 3 (three) times daily as needed for muscle spasms. Do not drink alcohol or drive while taking this medication.  May cause drowsiness. 15 capsule Corbin Dess, PA-C   naproxen (NAPROSYN) 500 MG tablet Take 1 tablet (500 mg total) by mouth 2 (two) times daily as needed. 30 tablet Corbin Dess, [place]      PDMP not reviewed this encounter.   Corbin Dess, [place] 12/26/23 1141

## 2024-01-07 ENCOUNTER — Other Ambulatory Visit: Payer: Self-pay | Admitting: Family Medicine

## 2024-01-19 ENCOUNTER — Other Ambulatory Visit: Payer: Self-pay | Admitting: Family Medicine

## 2024-01-31 ENCOUNTER — Other Ambulatory Visit: Payer: Self-pay | Admitting: Family Medicine
